# Patient Record
Sex: Male | Born: 1980 | Race: White | Hispanic: No | Marital: Married | Smoking: Never smoker
Health system: Southern US, Community
[De-identification: ages and names within clinical notes are randomized; demographics above are authoritative.]

## PROBLEM LIST (undated history)

## (undated) DIAGNOSIS — I10 Essential (primary) hypertension: Secondary | ICD-10-CM

## (undated) DIAGNOSIS — I2510 Atherosclerotic heart disease of native coronary artery without angina pectoris: Secondary | ICD-10-CM

## (undated) DIAGNOSIS — K219 Gastro-esophageal reflux disease without esophagitis: Secondary | ICD-10-CM

## (undated) DIAGNOSIS — E119 Type 2 diabetes mellitus without complications: Secondary | ICD-10-CM

## (undated) DIAGNOSIS — I219 Acute myocardial infarction, unspecified: Secondary | ICD-10-CM

## (undated) HISTORY — PX: SPLENECTOMY: SUR1306

## (undated) HISTORY — PX: CORONARY STENT PLACEMENT: SHX1402

---

## 2018-04-24 ENCOUNTER — Other Ambulatory Visit: Payer: Self-pay

## 2018-04-24 ENCOUNTER — Encounter (HOSPITAL_BASED_OUTPATIENT_CLINIC_OR_DEPARTMENT_OTHER): Payer: Self-pay | Admitting: Emergency Medicine

## 2018-04-24 ENCOUNTER — Emergency Department (HOSPITAL_BASED_OUTPATIENT_CLINIC_OR_DEPARTMENT_OTHER): Payer: No Typology Code available for payment source

## 2018-04-24 ENCOUNTER — Emergency Department (HOSPITAL_BASED_OUTPATIENT_CLINIC_OR_DEPARTMENT_OTHER)
Admission: EM | Admit: 2018-04-24 | Discharge: 2018-04-24 | Disposition: A | Discharge status: Home / Self Care | Payer: No Typology Code available for payment source | Attending: Emergency Medicine | Admitting: Emergency Medicine

## 2018-04-24 DIAGNOSIS — Y939 Activity, unspecified: Secondary | ICD-10-CM | POA: Insufficient documentation

## 2018-04-24 DIAGNOSIS — S0512XA Contusion of eyeball and orbital tissues, left eye, initial encounter: Secondary | ICD-10-CM

## 2018-04-24 DIAGNOSIS — Z79899 Other long term (current) drug therapy: Secondary | ICD-10-CM | POA: Insufficient documentation

## 2018-04-24 DIAGNOSIS — S0592XA Unspecified injury of left eye and orbit, initial encounter: Secondary | ICD-10-CM | POA: Diagnosis present

## 2018-04-24 DIAGNOSIS — I2510 Atherosclerotic heart disease of native coronary artery without angina pectoris: Secondary | ICD-10-CM | POA: Diagnosis not present

## 2018-04-24 DIAGNOSIS — E119 Type 2 diabetes mellitus without complications: Secondary | ICD-10-CM | POA: Insufficient documentation

## 2018-04-24 DIAGNOSIS — I10 Essential (primary) hypertension: Secondary | ICD-10-CM | POA: Diagnosis not present

## 2018-04-24 DIAGNOSIS — Z7982 Long term (current) use of aspirin: Secondary | ICD-10-CM | POA: Insufficient documentation

## 2018-04-24 DIAGNOSIS — S0012XA Contusion of left eyelid and periocular area, initial encounter: Secondary | ICD-10-CM | POA: Insufficient documentation

## 2018-04-24 DIAGNOSIS — Y990 Civilian activity done for income or pay: Secondary | ICD-10-CM | POA: Insufficient documentation

## 2018-04-24 DIAGNOSIS — Y929 Unspecified place or not applicable: Secondary | ICD-10-CM | POA: Diagnosis not present

## 2018-04-24 HISTORY — DX: Essential (primary) hypertension: I10

## 2018-04-24 HISTORY — DX: Acute myocardial infarction, unspecified: I21.9

## 2018-04-24 HISTORY — DX: Atherosclerotic heart disease of native coronary artery without angina pectoris: I25.10

## 2018-04-24 HISTORY — DX: Gastro-esophageal reflux disease without esophagitis: K21.9

## 2018-04-24 HISTORY — DX: Type 2 diabetes mellitus without complications: E11.9

## 2018-04-24 MED ORDER — TETRACAINE HCL 0.5 % OP SOLN
2.0000 [drp] | Freq: Once | OPHTHALMIC | Status: AC
  Administered 2018-04-24: 2 [drp] via OPHTHALMIC
  Filled 2018-04-24: qty 4

## 2018-04-24 MED ORDER — FLUORESCEIN SODIUM 1 MG OP STRP
1.0000 | ORAL_STRIP | Freq: Once | OPHTHALMIC | Status: AC
  Administered 2018-04-24: 1 via OPHTHALMIC
  Filled 2018-04-24: qty 1

## 2018-04-24 NOTE — Discharge Instructions (Addendum)
Apply ice to help with the swelling, take over-the-counter medications as needed for pain °

## 2018-04-24 NOTE — ED Notes (Signed)
Pt called supervisor to confirm need for UDS.

## 2018-04-24 NOTE — ED Notes (Signed)
ED Provider at bedside. 
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
## 2020-04-07 ENCOUNTER — Encounter (HOSPITAL_BASED_OUTPATIENT_CLINIC_OR_DEPARTMENT_OTHER): Payer: Self-pay | Admitting: Emergency Medicine

## 2020-04-07 ENCOUNTER — Emergency Department (HOSPITAL_BASED_OUTPATIENT_CLINIC_OR_DEPARTMENT_OTHER)
Admission: EM | Admit: 2020-04-07 | Discharge: 2020-04-07 | Disposition: A | Discharge status: Home / Self Care | Payer: No Typology Code available for payment source | Attending: Emergency Medicine | Admitting: Emergency Medicine

## 2020-04-07 ENCOUNTER — Emergency Department (HOSPITAL_BASED_OUTPATIENT_CLINIC_OR_DEPARTMENT_OTHER): Payer: No Typology Code available for payment source

## 2020-04-07 ENCOUNTER — Other Ambulatory Visit: Payer: Self-pay

## 2020-04-07 DIAGNOSIS — W228XXA Striking against or struck by other objects, initial encounter: Secondary | ICD-10-CM | POA: Diagnosis not present

## 2020-04-07 DIAGNOSIS — Z794 Long term (current) use of insulin: Secondary | ICD-10-CM | POA: Insufficient documentation

## 2020-04-07 DIAGNOSIS — I2510 Atherosclerotic heart disease of native coronary artery without angina pectoris: Secondary | ICD-10-CM | POA: Diagnosis not present

## 2020-04-07 DIAGNOSIS — Y9289 Other specified places as the place of occurrence of the external cause: Secondary | ICD-10-CM | POA: Insufficient documentation

## 2020-04-07 DIAGNOSIS — Y990 Civilian activity done for income or pay: Secondary | ICD-10-CM | POA: Diagnosis not present

## 2020-04-07 DIAGNOSIS — Z955 Presence of coronary angioplasty implant and graft: Secondary | ICD-10-CM | POA: Insufficient documentation

## 2020-04-07 DIAGNOSIS — S060X0A Concussion without loss of consciousness, initial encounter: Secondary | ICD-10-CM | POA: Insufficient documentation

## 2020-04-07 DIAGNOSIS — S0990XA Unspecified injury of head, initial encounter: Secondary | ICD-10-CM | POA: Diagnosis present

## 2020-04-07 DIAGNOSIS — Z7982 Long term (current) use of aspirin: Secondary | ICD-10-CM | POA: Insufficient documentation

## 2020-04-07 DIAGNOSIS — E119 Type 2 diabetes mellitus without complications: Secondary | ICD-10-CM | POA: Insufficient documentation

## 2020-04-07 DIAGNOSIS — I119 Hypertensive heart disease without heart failure: Secondary | ICD-10-CM | POA: Insufficient documentation
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
## 2020-04-07 NOTE — Discharge Instructions (Signed)
Your CT Head was normal at this time without signs of bleeding or skull fractures. Your symptoms are likely related to a concussion - please see attached information on concussions. It is recommended that you allow your brain to rest whenever possible over the next several days including sitting in a darkened room and avoiding bright lights from TV screens, cellphones, computers, etc. You can take the Zofran you have at home as needed.   If your symptoms continue you can follow up with the [facility]:  If you, your child, or a loved one you care for has suffered a sports-related head injury or potential concussion, we know how important it is to have access to the best advice and treatment. The [facility] is the only comprehensive, holistic concussion clinic in the [place], [place] area. You can speak with one of our concussion-trained staff members during our regular office hours, Monday - Thursday from 7:30 AM to 4:30 PM, and Fridays from 7:30 AM to 12:00 PM, by calling our Concussion Hotline: [phone].

## 2020-09-03 ENCOUNTER — Other Ambulatory Visit (HOSPITAL_BASED_OUTPATIENT_CLINIC_OR_DEPARTMENT_OTHER): Payer: Self-pay

## 2020-09-03 ENCOUNTER — Emergency Department (HOSPITAL_BASED_OUTPATIENT_CLINIC_OR_DEPARTMENT_OTHER): Payer: No Typology Code available for payment source

## 2020-09-03 ENCOUNTER — Emergency Department (HOSPITAL_BASED_OUTPATIENT_CLINIC_OR_DEPARTMENT_OTHER): Payer: 59

## 2020-09-03 ENCOUNTER — Other Ambulatory Visit: Payer: Self-pay

## 2020-09-03 ENCOUNTER — Encounter (HOSPITAL_BASED_OUTPATIENT_CLINIC_OR_DEPARTMENT_OTHER): Payer: Self-pay | Admitting: Emergency Medicine

## 2020-09-03 ENCOUNTER — Emergency Department (HOSPITAL_BASED_OUTPATIENT_CLINIC_OR_DEPARTMENT_OTHER)
Admission: EM | Admit: 2020-09-03 | Discharge: 2020-09-03 | Disposition: A | Discharge status: Home / Self Care | Payer: No Typology Code available for payment source | Attending: Emergency Medicine | Admitting: Emergency Medicine

## 2020-09-03 DIAGNOSIS — S0990XA Unspecified injury of head, initial encounter: Secondary | ICD-10-CM | POA: Diagnosis present

## 2020-09-03 DIAGNOSIS — Z79899 Other long term (current) drug therapy: Secondary | ICD-10-CM | POA: Insufficient documentation

## 2020-09-03 DIAGNOSIS — S0993XA Unspecified injury of face, initial encounter: Secondary | ICD-10-CM | POA: Diagnosis not present

## 2020-09-03 DIAGNOSIS — Z7982 Long term (current) use of aspirin: Secondary | ICD-10-CM | POA: Insufficient documentation

## 2020-09-03 DIAGNOSIS — E119 Type 2 diabetes mellitus without complications: Secondary | ICD-10-CM | POA: Insufficient documentation

## 2020-09-03 DIAGNOSIS — Z794 Long term (current) use of insulin: Secondary | ICD-10-CM | POA: Diagnosis not present

## 2020-09-03 DIAGNOSIS — I10 Essential (primary) hypertension: Secondary | ICD-10-CM | POA: Insufficient documentation

## 2020-09-03 DIAGNOSIS — W228XXA Striking against or struck by other objects, initial encounter: Secondary | ICD-10-CM | POA: Diagnosis not present

## 2020-09-03 DIAGNOSIS — R040 Epistaxis: Secondary | ICD-10-CM

## 2020-09-03 DIAGNOSIS — Z955 Presence of coronary angioplasty implant and graft: Secondary | ICD-10-CM | POA: Diagnosis not present

## 2020-09-03 DIAGNOSIS — I2510 Atherosclerotic heart disease of native coronary artery without angina pectoris: Secondary | ICD-10-CM | POA: Diagnosis not present

## 2020-09-03 DIAGNOSIS — Y990 Civilian activity done for income or pay: Secondary | ICD-10-CM | POA: Insufficient documentation

## 2020-09-03 MED ORDER — ACETAMINOPHEN 500 MG PO TABS
1000.0000 mg | ORAL_TABLET | Freq: Once | ORAL | Status: AC
  Administered 2020-09-03: 1000 mg via ORAL
  Filled 2020-09-03: qty 2

## 2020-09-03 MED ORDER — OXYMETAZOLINE HCL 0.05 % NA SOLN
1.0000 | Freq: Once | NASAL | Status: AC
  Administered 2020-09-03: 1 via NASAL
  Filled 2020-09-03: qty 30
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
## 2023-02-25 DEATH — deceased
